# Patient Record
Sex: Male | Born: 1980 | Race: Asian | Hispanic: No | Marital: Single | Smoking: Never smoker
Health system: Southern US, Community
[De-identification: ages and names within clinical notes are randomized; demographics above are authoritative.]

## PROBLEM LIST (undated history)

## (undated) DIAGNOSIS — R569 Unspecified convulsions: Secondary | ICD-10-CM

---

## 2005-08-04 ENCOUNTER — Emergency Department (HOSPITAL_COMMUNITY): Admission: EM | Admit: 2005-08-04 | Discharge: 2005-08-04 | Payer: Self-pay | Admitting: Emergency Medicine

## 2005-08-21 ENCOUNTER — Emergency Department (HOSPITAL_COMMUNITY): Admission: EM | Admit: 2005-08-21 | Discharge: 2005-08-22 | Payer: Self-pay | Admitting: Emergency Medicine

## 2005-09-23 ENCOUNTER — Inpatient Hospital Stay (HOSPITAL_COMMUNITY): Admission: AC | Admit: 2005-09-23 | Discharge: 2005-09-23 | Payer: Self-pay

## 2005-09-27 ENCOUNTER — Emergency Department (HOSPITAL_COMMUNITY): Admission: EM | Admit: 2005-09-27 | Discharge: 2005-09-28 | Payer: Self-pay | Admitting: Emergency Medicine

## 2005-12-20 ENCOUNTER — Emergency Department (HOSPITAL_COMMUNITY): Admission: EM | Admit: 2005-12-20 | Discharge: 2005-12-20 | Payer: Self-pay | Admitting: *Deleted

## 2006-04-01 ENCOUNTER — Emergency Department (HOSPITAL_COMMUNITY): Admission: EM | Admit: 2006-04-01 | Discharge: 2006-04-01 | Payer: Self-pay | Admitting: Emergency Medicine

## 2006-04-04 ENCOUNTER — Emergency Department (HOSPITAL_COMMUNITY): Admission: EM | Admit: 2006-04-04 | Discharge: 2006-04-05 | Payer: Self-pay | Admitting: Emergency Medicine

## 2006-04-07 ENCOUNTER — Emergency Department (HOSPITAL_COMMUNITY): Admission: EM | Admit: 2006-04-07 | Discharge: 2006-04-07 | Payer: Self-pay | Admitting: Emergency Medicine

## 2006-10-17 ENCOUNTER — Emergency Department (HOSPITAL_COMMUNITY): Admission: EM | Admit: 2006-10-17 | Discharge: 2006-10-17 | Payer: Self-pay | Admitting: Emergency Medicine

## 2006-10-18 ENCOUNTER — Emergency Department (HOSPITAL_COMMUNITY): Admission: EM | Admit: 2006-10-18 | Discharge: 2006-10-18 | Payer: Self-pay | Admitting: Emergency Medicine

## 2006-10-20 ENCOUNTER — Emergency Department (HOSPITAL_COMMUNITY): Admission: EM | Admit: 2006-10-20 | Discharge: 2006-10-20 | Payer: Self-pay | Admitting: Emergency Medicine

## 2007-01-09 ENCOUNTER — Emergency Department (HOSPITAL_COMMUNITY): Admission: EM | Admit: 2007-01-09 | Discharge: 2007-01-09 | Payer: Self-pay | Admitting: Emergency Medicine

## 2007-06-09 ENCOUNTER — Emergency Department: Payer: Self-pay | Admitting: Unknown Physician Specialty

## 2007-08-03 ENCOUNTER — Emergency Department: Payer: Self-pay | Admitting: Emergency Medicine

## 2007-11-20 ENCOUNTER — Emergency Department: Payer: Self-pay | Admitting: Emergency Medicine

## 2007-12-01 ENCOUNTER — Emergency Department: Payer: Self-pay | Admitting: Emergency Medicine

## 2007-12-03 ENCOUNTER — Emergency Department (HOSPITAL_COMMUNITY): Admission: EM | Admit: 2007-12-03 | Discharge: 2007-12-03 | Payer: Self-pay | Admitting: Emergency Medicine

## 2008-01-02 ENCOUNTER — Emergency Department: Payer: Self-pay | Admitting: Emergency Medicine

## 2008-01-07 ENCOUNTER — Emergency Department (HOSPITAL_BASED_OUTPATIENT_CLINIC_OR_DEPARTMENT_OTHER): Admission: EM | Admit: 2008-01-07 | Discharge: 2008-01-07 | Payer: Self-pay | Admitting: Emergency Medicine

## 2008-09-24 ENCOUNTER — Emergency Department (HOSPITAL_BASED_OUTPATIENT_CLINIC_OR_DEPARTMENT_OTHER): Admission: EM | Admit: 2008-09-24 | Discharge: 2008-09-24 | Payer: Self-pay | Admitting: Emergency Medicine

## 2008-12-02 ENCOUNTER — Emergency Department: Payer: Self-pay | Admitting: Emergency Medicine

## 2009-06-11 ENCOUNTER — Emergency Department: Payer: Self-pay | Admitting: Emergency Medicine

## 2010-01-11 ENCOUNTER — Emergency Department (HOSPITAL_COMMUNITY)
Admission: EM | Admit: 2010-01-11 | Discharge: 2010-01-11 | Payer: Self-pay | Source: Home / Self Care | Admitting: Emergency Medicine

## 2010-01-11 ENCOUNTER — Emergency Department (HOSPITAL_COMMUNITY)
Admission: EM | Admit: 2010-01-11 | Discharge: 2010-01-11 | Disposition: A | Discharge status: Other Acute Inpatient Hospital | Payer: Self-pay | Source: Home / Self Care | Admitting: Emergency Medicine

## 2010-04-06 LAB — GLUCOSE, CAPILLARY: Glucose-Capillary: 106 mg/dL — ABNORMAL HIGH (ref 70–99)

## 2010-05-02 LAB — ROCKY MTN SPOTTED FVR AB, IGG-BLOOD: RMSF IgG: 0.12 IV

## 2010-05-02 LAB — ROCKY MTN SPOTTED FVR AB, IGM-BLOOD: RMSF IgM: 0.46 IV (ref 0.00–0.89)

## 2010-08-23 ENCOUNTER — Emergency Department: Payer: Self-pay | Admitting: Unknown Physician Specialty

## 2017-01-18 ENCOUNTER — Other Ambulatory Visit: Payer: Self-pay

## 2017-01-18 ENCOUNTER — Emergency Department (HOSPITAL_COMMUNITY)
Admission: EM | Admit: 2017-01-18 | Discharge: 2017-01-18 | Disposition: A | Discharge status: Home / Self Care | Payer: Self-pay | Attending: Emergency Medicine | Admitting: Emergency Medicine

## 2017-01-18 ENCOUNTER — Encounter (HOSPITAL_COMMUNITY): Payer: Self-pay | Admitting: Emergency Medicine

## 2017-01-18 DIAGNOSIS — M545 Low back pain: Secondary | ICD-10-CM | POA: Insufficient documentation

## 2017-01-18 DIAGNOSIS — Z5321 Procedure and treatment not carried out due to patient leaving prior to being seen by health care provider: Secondary | ICD-10-CM | POA: Insufficient documentation

## 2017-01-18 HISTORY — DX: Unspecified convulsions: R56.9

## 2017-01-18 NOTE — ED Triage Notes (Addendum)
Pt states he fell off deck on Sunday, approx 8 feet-- bit tongue, c/o low back pain, hurts to move. Denies LOC.  Pt states he has a seizure history-- does not think that he had a seizure causing fall.

## 2017-01-25 HISTORY — PX: SHOULDER ARTHROSCOPY W/ ROTATOR CUFF REPAIR: SHX2400

## 2017-01-25 HISTORY — PX: SHOULDER ARTHROSCOPY WITH CAPSULORRHAPHY: SHX6454

## 2017-01-31 ENCOUNTER — Emergency Department (HOSPITAL_COMMUNITY): Payer: Self-pay

## 2017-01-31 ENCOUNTER — Emergency Department (HOSPITAL_COMMUNITY)
Admission: EM | Admit: 2017-01-31 | Discharge: 2017-01-31 | Disposition: A | Discharge status: Home / Self Care | Payer: Self-pay | Attending: Emergency Medicine | Admitting: Emergency Medicine

## 2017-01-31 ENCOUNTER — Encounter (HOSPITAL_COMMUNITY): Payer: Self-pay | Admitting: Pharmacy Technician

## 2017-01-31 DIAGNOSIS — R569 Unspecified convulsions: Secondary | ICD-10-CM | POA: Insufficient documentation

## 2017-01-31 LAB — CBC WITH DIFFERENTIAL/PLATELET
BASOS ABS: 0.1 10*3/uL (ref 0.0–0.1)
BASOS PCT: 1 %
EOS PCT: 5 %
Eosinophils Absolute: 0.5 10*3/uL (ref 0.0–0.7)
HEMATOCRIT: 46.3 % (ref 39.0–52.0)
Hemoglobin: 16.1 g/dL (ref 13.0–17.0)
Lymphocytes Relative: 26 %
Lymphs Abs: 2.8 10*3/uL (ref 0.7–4.0)
MCH: 30.4 pg (ref 26.0–34.0)
MCHC: 34.8 g/dL (ref 30.0–36.0)
MCV: 87.4 fL (ref 78.0–100.0)
Monocytes Absolute: 0.6 10*3/uL (ref 0.1–1.0)
Monocytes Relative: 5 %
NEUTROS ABS: 6.9 10*3/uL (ref 1.7–7.7)
Neutrophils Relative %: 63 %
PLATELETS: 226 10*3/uL (ref 150–400)
RBC: 5.3 MIL/uL (ref 4.22–5.81)
RDW: 13.7 % (ref 11.5–15.5)
WBC: 10.9 10*3/uL — AB (ref 4.0–10.5)

## 2017-01-31 LAB — COMPREHENSIVE METABOLIC PANEL
ALBUMIN: 4.3 g/dL (ref 3.5–5.0)
ALT: 12 U/L — ABNORMAL LOW (ref 17–63)
AST: 25 U/L (ref 15–41)
Alkaline Phosphatase: 63 U/L (ref 38–126)
Anion gap: 10 (ref 5–15)
BUN: 9 mg/dL (ref 6–20)
CHLORIDE: 101 mmol/L (ref 101–111)
CO2: 24 mmol/L (ref 22–32)
Calcium: 9.7 mg/dL (ref 8.9–10.3)
Creatinine, Ser: 1.23 mg/dL (ref 0.61–1.24)
GFR calc Af Amer: >60 mL/min (ref >60)
GFR calc non Af Amer: >60 mL/min (ref >60)
GLUCOSE: 94 mg/dL (ref 65–99)
POTASSIUM: 3.7 mmol/L (ref 3.5–5.1)
Sodium: 135 mmol/L (ref 135–145)
Total Bilirubin: 1.2 mg/dL (ref 0.3–1.2)
Total Protein: 7.1 g/dL (ref 6.5–8.1)

## 2017-01-31 LAB — ETHANOL: Alcohol, Ethyl (B): <10 mg/dL (ref <10)

## 2017-01-31 MED ORDER — LAMOTRIGINE 25 MG PO TABS: 100.0000 mg | ORAL_TABLET | Freq: Every day | ORAL | 0 refills | Status: AC

## 2017-01-31 MED ORDER — SODIUM CHLORIDE 0.9 % IV BOLUS (SEPSIS)
1000.0000 mL | Freq: Once | INTRAVENOUS | Status: AC
  Administered 2017-01-31: 1000 mL via INTRAVENOUS

## 2017-01-31 MED ORDER — LAMOTRIGINE 25 MG PO TABS
50.0000 mg | ORAL_TABLET | Freq: Once | ORAL | Status: AC
  Administered 2017-01-31: 50 mg via ORAL
  Filled 2017-01-31: qty 2

## 2017-01-31 NOTE — ED Triage Notes (Signed)
Pt arrives via EMS with reports of witnessed seizureX2 grand mal in nature. Hx epilepsy. Over last month has had more frequent seizures. Takes lamictal, no changes in meds. Was in the passenger side of a car when seizures happened. Did bite tongue. 140/86, 96%, cbg 107, HR 100.

## 2017-01-31 NOTE — Discharge Instructions (Signed)
Please take your lamictal as prescribed.   See your neurologist as scheduled this week.   Do not drive for now   Return to ER if you have another seizure, head injury.

## 2017-01-31 NOTE — ED Provider Notes (Signed)
[facility] Provider Note   CSN: [id number] Arrival date & time: 01/31/17  4098     History   Chief Complaint Chief Complaint  Patient presents with  . Seizures    HPI [facility] is a 37 y.o. male history of postconcussive syndrome, seizure disorder here presenting with seizures.  Patient states that he has been working 2 jobs and has not slept for 40 hours due to his work schedule.  He was on his way from work and his girlfriend was driving and he stared into space and had a tonic-clonic seizure-like activity for about a minute.  He then became confused and had another seizure-like activity for about a minute and subsequently bit his tongue during that time.  He then was confused and girlfriend called EMS he was brought in.  He said that he woke up in the ambulance and did not remember what happened.  Of note, patient does have a history of seizures and is on Lamictal and is taking 50 mg in the morning and 50 mg in the evening. He had a fall several weeks ago and was seen in the ED for seizure like activity but CT head was never performed.   The history is provided by the patient.    Past Medical History:  Diagnosis Date  . Seizures (HCC)     There are no active problems to display for this patient.   History reviewed. No pertinent surgical history.     Home Medications    Prior to Admission medications   Medication Sig Start Date End Date Taking? Authorizing Provider  HYDROcodone-acetaminophen (NORCO/VICODIN) 5-325 MG tablet Take 1 tablet by mouth every 6 (six) hours as needed. for pain 01/21/17  Yes [provider]  ibuprofen (ADVIL,MOTRIN) 800 MG tablet Take 800 mg by mouth 3 (three) times daily. 01/19/17  Yes [provider]  orphenadrine (NORFLEX) 100 MG tablet Take 100 mg by mouth 2 (two) times daily as needed. 01/19/17  Yes [provider]  lamoTRIgine (LAMICTAL) 25 MG tablet Take 4 tablets (100 mg  total) by mouth daily. 01/31/17   Richard Pander, MD    Family History No family history on file.  Social History Social History   Tobacco Use  . Smoking status: Never Smoker  . Smokeless tobacco: Never Used  Substance Use Topics  . Alcohol use: Yes    Comment: occasionally  . Drug use: No     Allergies   Patient has no known allergies.   Review of Systems Review of Systems  Neurological: Positive for seizures.  All other systems reviewed and are negative.    Physical Exam Updated Vital Signs BP 119/74   Pulse 70   Resp (!) 21   SpO2 96%   Physical Exam  Constitutional: He is oriented to person, place, and time. He appears well-developed.  HENT:  Head: Normocephalic.  Mouth/Throat: Oropharynx is clear and moist.  Tongue abrasion R side   Eyes: Conjunctivae and EOM are normal. Pupils are equal, round, and reactive to light.  Neck: Normal range of motion. Neck supple.  Cardiovascular: Normal rate, regular rhythm and normal heart sounds.  Pulmonary/Chest: Effort normal and breath sounds normal. No stridor. No respiratory distress. He has no wheezes.  Abdominal: Soft. Bowel sounds are normal. He exhibits no distension. There is no tenderness.  Musculoskeletal: Normal range of motion.  Neurological: He is alert and oriented to person, place, and time. No cranial nerve deficit. Coordination normal.  Skin: Skin is warm.  Psychiatric: He has a normal mood and affect.  Nursing note and vitals reviewed.    ED Treatments / Results  Labs (all labs ordered are listed, but only abnormal results are displayed) Labs Reviewed  CBC WITH DIFFERENTIAL/PLATELET - Abnormal; Notable for the following components:      Result Value   WBC 10.9 (*)    All other components within normal limits  COMPREHENSIVE METABOLIC PANEL - Abnormal; Notable for the following components:   ALT 12 (*)    All other components within normal limits  ETHANOL  LAMOTRIGINE LEVEL    EKG  EKG  Interpretation  Date/Time:  Monday January 31 2017 19:01:11 EST Ventricular Rate:  95 PR Interval:    QRS Duration: 83 QT Interval:  358 QTC Calculation: 450 R Axis:   0 Text Interpretation:  Sinus rhythm LAE, consider biatrial enlargement RSR' in V1 or V2, right VCD or RVH No significant change since last tracing Confirmed by Richardean CanalYao, Saraiah Bhat H [phone]([id number]) on 01/31/2017 9:19:18 PM       Radiology Ct Head Wo Contrast  Result Date: 01/31/2017 CLINICAL DATA:  Weakness seizures. EXAM: CT HEAD WITHOUT CONTRAST TECHNIQUE: Contiguous axial images were obtained from the base of the skull through the vertex without intravenous contrast. COMPARISON:  01/11/2010 FINDINGS: Brain: No evidence of acute infarction, hemorrhage, hydrocephalus, extra-axial collection or mass lesion/mass effect. Vascular: No hyperdense vessel or unexpected calcification. Skull: Normal. Negative for fracture or focal lesion. Sinuses/Orbits: No acute finding. Other: None. IMPRESSION: No acute intracranial abnormality. Electronically Signed   By: Ted Mcalpineobrinka  Dimitrova M.D.   On: 01/31/2017 21:18    Procedures Procedures (including critical care time)  Medications Ordered in ED Medications  sodium chloride 0.9 % bolus 1,000 mL (0 mLs Intravenous Stopped 01/31/17 2239)  lamoTRIgine (LAMICTAL) tablet 50 mg (50 mg Oral Given 01/31/17 2123)     Initial Impression / Assessment and Plan / ED Course  I have reviewed the triage vital signs and the nursing notes.  Pertinent labs & imaging results that were available during my care of the patient were reviewed by me and considered in my medical decision making (see chart for details).     [facility] is a 37 y.o. male here with seizure. Hx of seizures and didn't sleep for 40 hours due to work. So likely lower seizure threshold from sleep deprivation. He did have a fall 2 weeks ago so will get CT head as well.   10:58 PM Labs unremarkable. Given lamictal in the ED. Awake, alert, back to  baseline. [facility]. Will continue current dose until he follows up. No driving until cleared by neurologist.    Final Clinical Impressions(s) / ED Diagnoses   Final diagnoses:  Seizure [facility](HCC)    ED Discharge Orders        Ordered    lamoTRIgine (LAMICTAL) 25 MG tablet  Daily     01/31/17 2254       Richard PanderYao, Pecola Haxton Hsienta, MD 01/31/17 2258

## 2018-10-03 ENCOUNTER — Encounter: Payer: Self-pay | Admitting: Neurology

## 2018-11-24 ENCOUNTER — Ambulatory Visit: Payer: Self-pay | Admitting: Neurology

## 2019-12-05 IMAGING — CT CT HEAD W/O CM
3 series · 15 of 47 positions shown, 18 images · non-contrast
Comparison: 01/11/2010

CLINICAL DATA: Weakness seizures.

EXAM:
CT HEAD WITHOUT CONTRAST
TECHNIQUE: Contiguous axial images were obtained from the base of the skull
through the vertex without intravenous contrast.

[Series 3: head 5.0 h30s · axial · 0.45mm/px · z∈[-75,+60]mm · 9 of 33 slices shown, 12 images]
[im 3/33  brain]
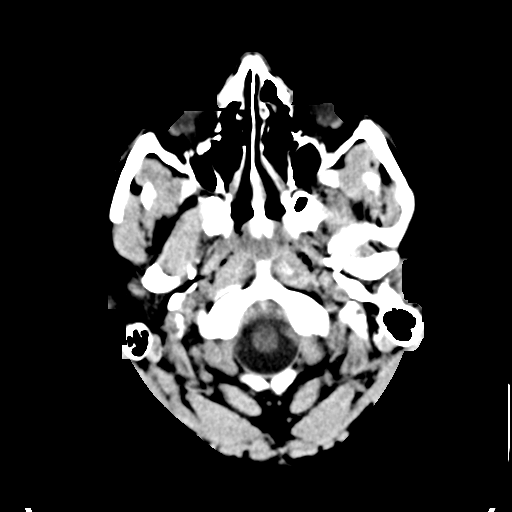
[im 3/33  bone]
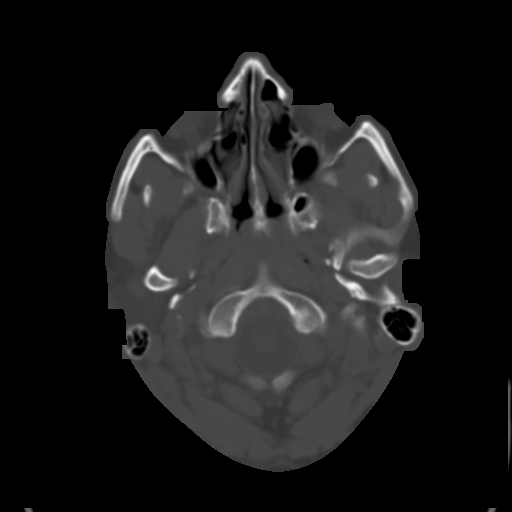
[im 6/33  brain]
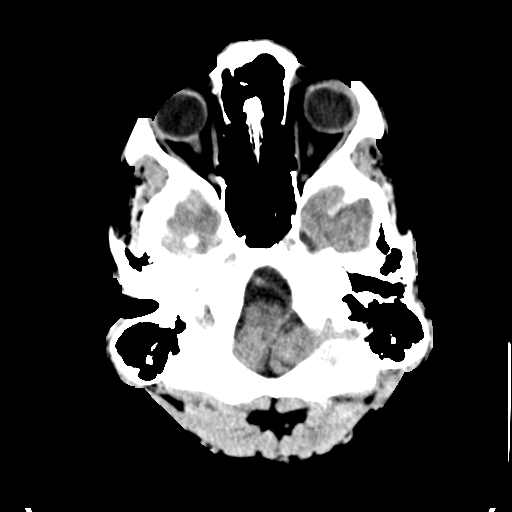
[im 9/33  brain]
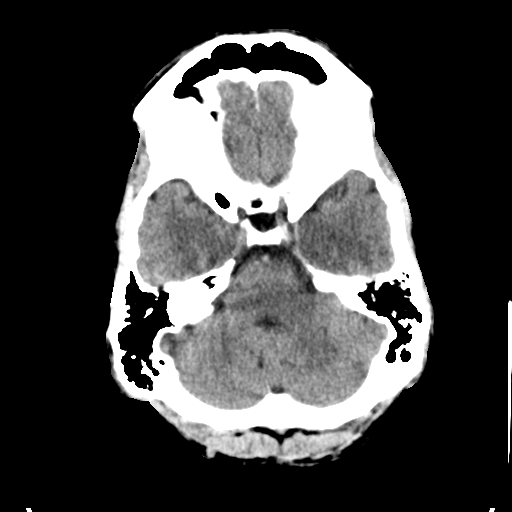
[im 13/33  brain]
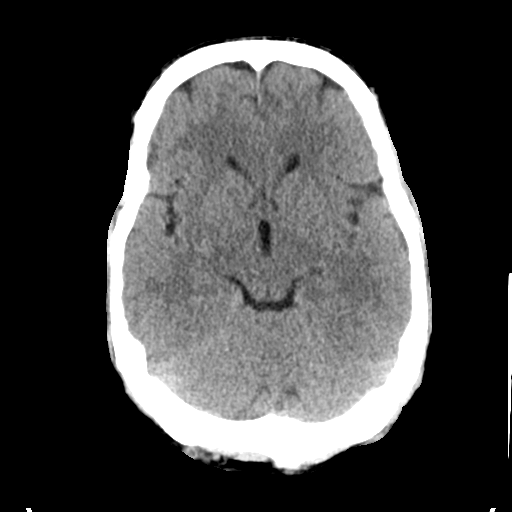
[im 17/33  brain]
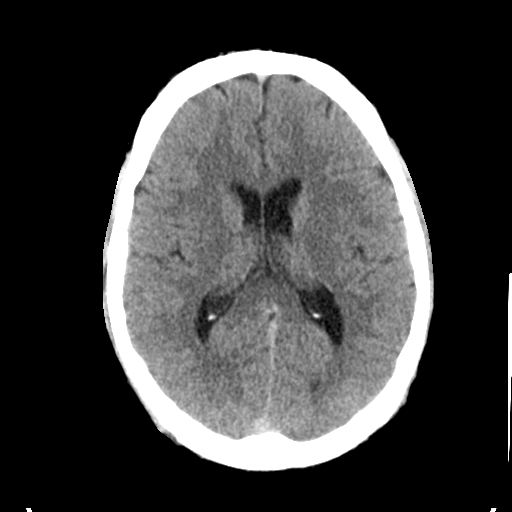
[im 17/33  bone]
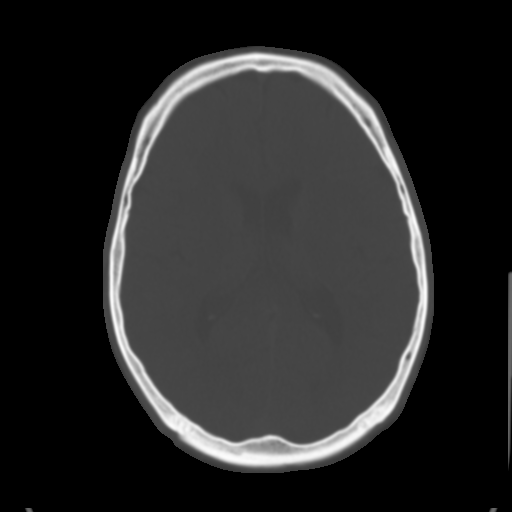
[im 20/33  brain]
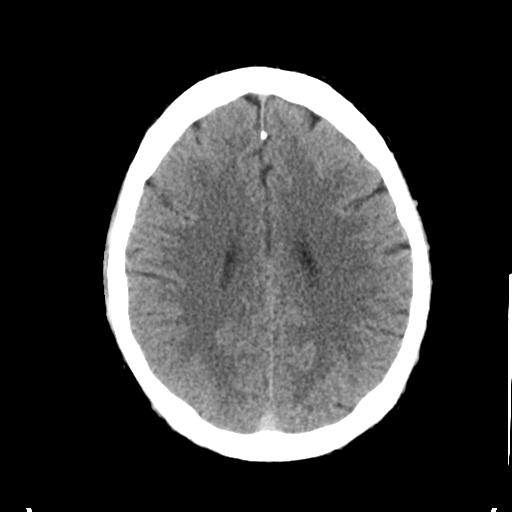
[im 24/33  brain]
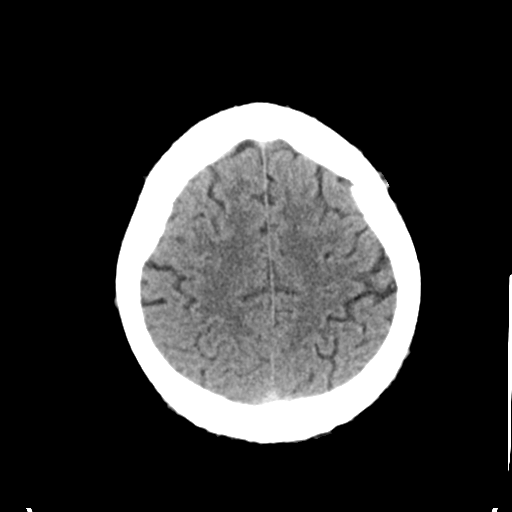
[im 27/33  brain]
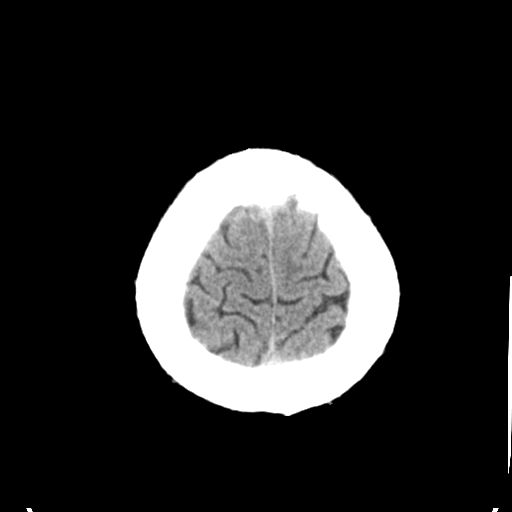
[im 30/33  brain]
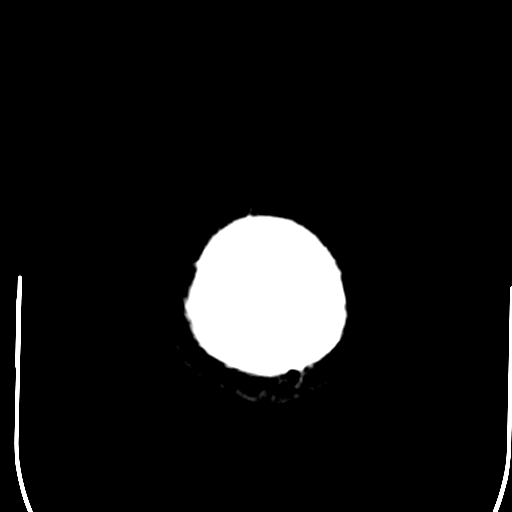
[im 30/33  bone]
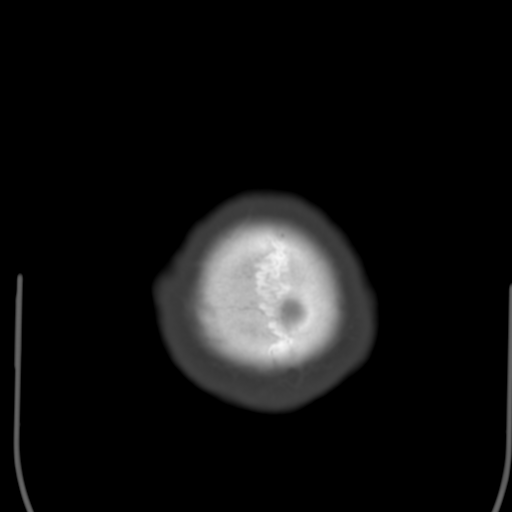

[Series 5: head 3.0 mpr cor · coronal · 0.31mm/px · 3 of 69 slices shown]
[im 23/69  brain]
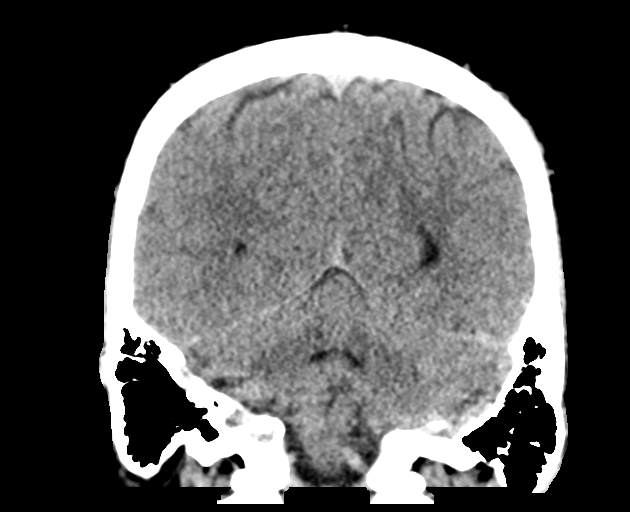
[im 31/69  brain]
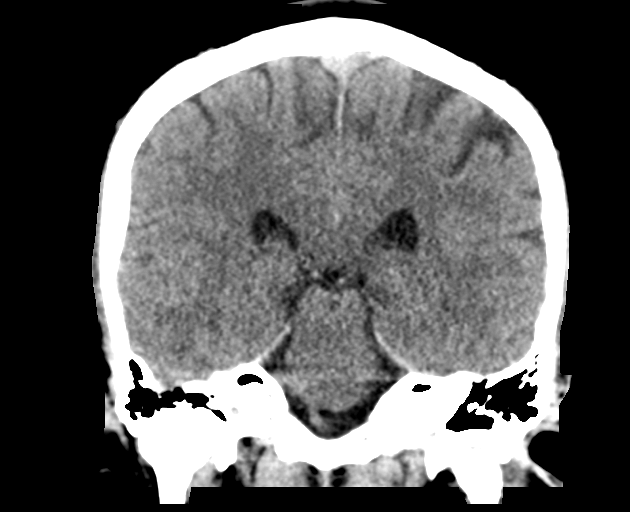
[im 38/69  brain]
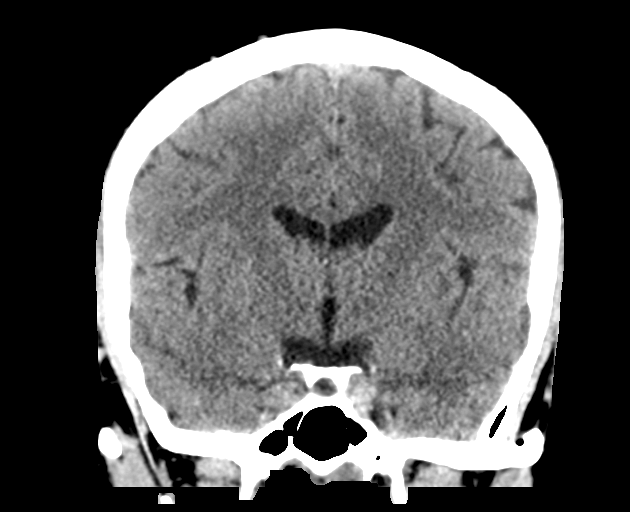

[Series 6: head 3.0 mpr sag · sagittal · 0.32mm/px · 3 of 67 slices shown]
[im 23/67  brain]
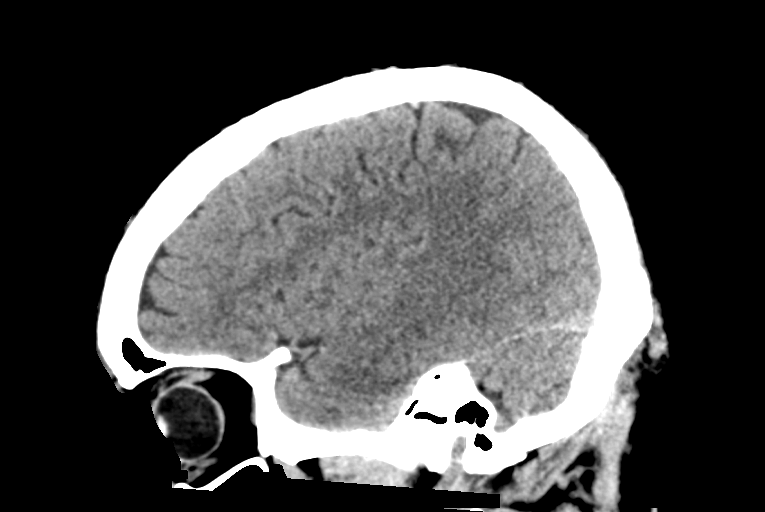
[im 34/67  brain]
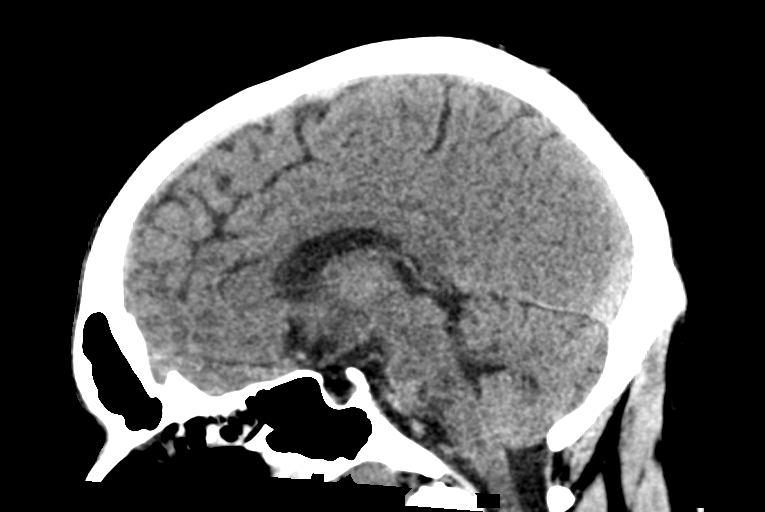
[im 45/67  brain]
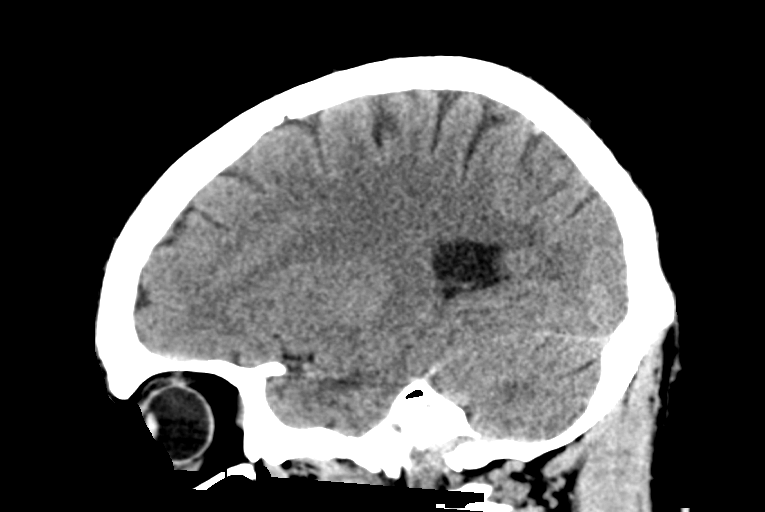

[15 of 47 positions shown; findings below may reference images not displayed]

FINDINGS: Brain: No evidence of acute infarction, hemorrhage, hydrocephalus,
extra-axial collection or mass lesion/mass effect.

Vascular: No hyperdense vessel or unexpected calcification.

Skull: Normal. Negative for fracture or focal lesion.

Sinuses/Orbits: No acute finding.

Other: None.
IMPRESSION: No acute intracranial abnormality.

## 2023-04-26 ENCOUNTER — Encounter: Payer: Self-pay | Admitting: Family Medicine

## 2023-04-26 DIAGNOSIS — F411 Generalized anxiety disorder: Secondary | ICD-10-CM

## 2023-04-26 DIAGNOSIS — G40909 Epilepsy, unspecified, not intractable, without status epilepticus: Secondary | ICD-10-CM

## 2023-04-26 DIAGNOSIS — F3342 Major depressive disorder, recurrent, in full remission: Secondary | ICD-10-CM | POA: Insufficient documentation

## 2023-04-26 HISTORY — DX: Generalized anxiety disorder: F41.1

## 2023-04-26 HISTORY — DX: Major depressive disorder, recurrent, in full remission: F33.42

## 2023-04-26 HISTORY — DX: Epilepsy, unspecified, not intractable, without status epilepticus: G40.909

## 2023-04-26 NOTE — Progress Notes (Deleted)
     Richard Orihuela T. Keita Demarco, MD, CAQ Sports Medicine [facility] [address] [place], [address]  Phone: [phone]  FAX: [phone]  [facility] - 43 y.o. male  MRN [id number]  Date of Birth: 04-07-1980  Date: 04/28/2023  PCP: Patient, No Pcp Per  Referral: No ref. provider found  No chief complaint on file.  Subjective:   [facility] is a 43 y.o. very pleasant male patient with There is no height or weight on file to calculate BMI. who presents with the following:  He is a very nice patient who is new to me and new to our medical group.  He presents as a new patient as well as for complete physical exam.    Review of Systems is noted in the HPI, as appropriate  Objective:   There were no vitals taken for this visit.  GEN: No acute distress; alert,appropriate. PULM: Breathing comfortably in no respiratory distress PSYCH: Normally interactive.   Laboratory and Imaging Data:  Assessment and Plan:   ***

## 2023-04-28 ENCOUNTER — Ambulatory Visit: Payer: BC Managed Care – PPO | Admitting: Family Medicine

## 2023-04-28 DIAGNOSIS — F411 Generalized anxiety disorder: Secondary | ICD-10-CM

## 2023-04-28 DIAGNOSIS — F3342 Major depressive disorder, recurrent, in full remission: Secondary | ICD-10-CM

## 2023-04-28 DIAGNOSIS — G40909 Epilepsy, unspecified, not intractable, without status epilepticus: Secondary | ICD-10-CM

## 2023-06-28 NOTE — Progress Notes (Deleted)
 Richard Dirocco T. Rykar Lebleu, MD, CAQ Sports Medicine [facility] at [facility] [address] [place], [address]  Phone: [phone]  FAX: [phone]  [facility] - 43 y.o. male  MRN [id number]  Date of Birth: 08-Apr-1980  Date: 06/29/2023  PCP: Patient, No Pcp Per  Referral: No ref. provider found  No chief complaint on file.  Patient Care Team: Patient, No Pcp Per as PCP - General (General Practice) Subjective:   [facility] is a 43 y.o. pleasant patient who presents with the following:  Preventative Health Maintenance Visit:  Health Maintenance Summary Reviewed and updated, unless pt declines services.  Tobacco History Reviewed. Alcohol: No concerns, no excessive use Exercise Habits: Some activity, rec at least 30 mins 5 times a week STD concerns: no risk or activity to increase risk Drug Use: None  Patient does have a seizure disorder.  On chart review, when last noted in the medical chart he was taking Lamictal  for seizure control. - He does see Dr. Hilma Lucks from [facility] for neurology  He is here as a new patient to me today.    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  Review of Systems: Pertinent positives are listed above.  Otherwise, a full 14 point review of systems has been done in full and it is negative except where it is noted positive.  Objective:   There were no vitals taken for this visit. Ideal Body Weight:    Ideal Body Weight:   No results found.     No data to display           GEN: well developed, well nourished, no acute distress Eyes: conjunctiva and lids normal, PERRLA, EOMI ENT: TM clear, nares clear, oral exam  WNL Neck: supple, no lymphadenopathy, no thyromegaly, no JVD Pulm: clear to auscultation and percussion, respiratory effort normal CV: regular rate and rhythm, S1-S2, no murmur, rub or gallop, no bruits, peripheral pulses normal and symmetric, no cyanosis, clubbing, edema or varicosities GI: soft, non-tender; no hepatosplenomegaly, masses; active bowel sounds all quadrants GU: deferred Lymph: no cervical, axillary or inguinal adenopathy MSK: gait normal, muscle tone and strength WNL, no joint swelling, effusions, discoloration, crepitus  SKIN: clear, good turgor, color WNL, no rashes, lesions, or ulcerations Neuro: normal mental status, normal strength,  sensation, and motion Psych: alert; oriented to person, place and time, normally interactive and not anxious or depressed in appearance.  All labs reviewed with patient. Results for orders placed or performed during the hospital encounter of 01/31/17  Ethanol   Collection Time: 01/31/17  7:09 PM  Result Value Ref Range   Alcohol, Ethyl (B) <10 <10 mg/dL  CBC with Differential/Platelet   Collection Time: 01/31/17  7:22 PM  Result Value Ref Range   WBC 10.9 (H) 4.0 - 10.5 K/uL   RBC 5.30 4.22 - 5.81 MIL/uL   Hemoglobin 16.1 13.0 - 17.0 g/dL   HCT 16.1 09.6 - 04.5 %   MCV 87.4 78.0 - 100.0 fL   MCH 30.4 26.0 - 34.0 pg   MCHC 34.8 30.0 - 36.0 g/dL   RDW 40.9 81.1 - 91.4 %   Platelets 226 150 - 400 K/uL   Neutrophils Relative % 63 %   Neutro Abs 6.9 1.7 - 7.7 K/uL   Lymphocytes Relative 26 %   Lymphs Abs 2.8 0.7 - 4.0 K/uL   Monocytes Relative 5 %   Monocytes Absolute 0.6 0.1 - 1.0 K/uL   Eosinophils Relative 5 %   Eosinophils Absolute 0.5 0.0 - 0.7 K/uL   Basophils Relative 1 %   Basophils Absolute 0.1 0.0 - 0.1 K/uL  Comprehensive metabolic panel   Collection Time: 01/31/17  7:22 PM  Result Value Ref Range   Sodium 135 135 - 145 mmol/L   Potassium 3.7 3.5 - 5.1 mmol/L   Chloride 101 101 - 111 mmol/L   CO2 24 22 - 32 mmol/L   Glucose, Bld 94 65 - 99 mg/dL   BUN 9 6 - 20 mg/dL   Creatinine, Ser 7.82 0.61 - 1.24 mg/dL   Calcium 9.7 8.9 - 95.6 mg/dL   Total Protein 7.1 6.5 - 8.1 g/dL   Albumin 4.3 3.5 - 5.0 g/dL   AST 25 15 - 41 U/L   ALT 12 (L) 17 - 63 U/L   Alkaline Phosphatase 63 38 - 126 U/L   Total Bilirubin 1.2 0.3 - 1.2 mg/dL   GFR calc non Af Amer >60 >60 mL/min   GFR calc Af Amer >60 >60 mL/min   Anion gap 10 5 - 15    Assessment and Plan:     ICD-10-CM   1. Healthcare maintenance  Z00.00       Health Maintenance Exam: The patient's preventative maintenance and recommended screening tests for an annual wellness exam were reviewed in full today. Brought  up to date unless services declined.  Counselled on the importance of diet, exercise, and its role in overall health and mortality. The patient's FH and SH was reviewed, including their home life, tobacco status, and drug and alcohol status.  Follow-up in 1 year for physical exam or additional follow-up below.  Disposition: No follow-ups on file.  No orders of the defined types were placed in this  encounter.  Medications Discontinued During This Encounter  Medication Reason   HYDROcodone-acetaminophen (NORCO/VICODIN) 5-325 MG tablet    No orders of the defined types were placed in this encounter.   Signed,  Ranny Bye. Canisha Issac, MD   Allergies as of 06/29/2023   No Known Allergies      Medication List        Accurate as of June 28, 2023  2:10 PM. If you have any questions, ask your nurse or doctor.          STOP taking these medications    HYDROcodone-acetaminophen 5-325 MG tablet Commonly known as: NORCO/VICODIN       TAKE these medications    ibuprofen 800 MG tablet Commonly known as: ADVIL Take 800 mg by mouth 3 (three) times daily.   lamoTRIgine  25 MG tablet Commonly known as: LAMICTAL  Take 4 tablets (100 mg total) by mouth daily.   orphenadrine 100 MG tablet Commonly known as: NORFLEX Take 100 mg by mouth 2 (two) times daily as needed.

## 2023-06-29 ENCOUNTER — Ambulatory Visit: Admitting: Family Medicine

## 2023-06-29 DIAGNOSIS — Z0000 Encounter for general adult medical examination without abnormal findings: Secondary | ICD-10-CM
